# Patient Record
Sex: Female | Born: 1970 | Race: White | Hispanic: Yes | Marital: Married | Smoking: Never smoker
Health system: Southern US, Community
[De-identification: ages and names within clinical notes are randomized; demographics above are authoritative.]

## PROBLEM LIST (undated history)

## (undated) DIAGNOSIS — I10 Essential (primary) hypertension: Secondary | ICD-10-CM

---

## 2004-04-19 ENCOUNTER — Inpatient Hospital Stay (HOSPITAL_COMMUNITY): Admission: AD | Admit: 2004-04-19 | Discharge: 2004-04-20 | Payer: Self-pay | Admitting: *Deleted

## 2005-11-17 ENCOUNTER — Observation Stay (HOSPITAL_COMMUNITY): Admission: EM | Admit: 2005-11-17 | Discharge: 2005-11-18 | Payer: Self-pay | Admitting: Emergency Medicine

## 2007-12-10 ENCOUNTER — Inpatient Hospital Stay (HOSPITAL_COMMUNITY): Admission: AD | Admit: 2007-12-10 | Discharge: 2007-12-13 | Payer: Self-pay | Admitting: Obstetrics
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
## 2011-02-27 ENCOUNTER — Other Ambulatory Visit: Payer: Self-pay | Admitting: Family Medicine

## 2011-02-27 DIAGNOSIS — Z1231 Encounter for screening mammogram for malignant neoplasm of breast: Secondary | ICD-10-CM

## 2011-03-09 ENCOUNTER — Ambulatory Visit (HOSPITAL_COMMUNITY)
Admission: RE | Admit: 2011-03-09 | Discharge: 2011-03-09 | Disposition: A | Discharge status: Home / Self Care | Payer: Self-pay | Source: Ambulatory Visit | Attending: Family Medicine | Admitting: Family Medicine

## 2011-03-09 DIAGNOSIS — Z1231 Encounter for screening mammogram for malignant neoplasm of breast: Secondary | ICD-10-CM

## 2011-06-12 LAB — CBC
HCT: 29.5 — ABNORMAL LOW
HCT: 34 — ABNORMAL LOW
Hemoglobin: 10.4 — ABNORMAL LOW
Hemoglobin: 12.1
MCHC: 35
MCHC: 35.7
MCV: 90
MCV: 91.5
Platelets: 167
Platelets: 219
RBC: 3.23 — ABNORMAL LOW
RBC: 3.78 — ABNORMAL LOW
RDW: 13.8
RDW: 13.9
WBC: 10.5
WBC: 13.2 — ABNORMAL HIGH

## 2011-06-12 LAB — RPR: RPR Ser Ql: NONREACTIVE

## 2011-06-12 LAB — RAPID HIV SCREEN (WH-MAU): Rapid HIV Screen: NONREACTIVE

## 2014-04-16 ENCOUNTER — Encounter (HOSPITAL_COMMUNITY): Payer: Self-pay | Admitting: Emergency Medicine

## 2014-04-16 ENCOUNTER — Emergency Department (INDEPENDENT_AMBULATORY_CARE_PROVIDER_SITE_OTHER): Payer: Self-pay

## 2014-04-16 ENCOUNTER — Emergency Department (INDEPENDENT_AMBULATORY_CARE_PROVIDER_SITE_OTHER)
Admission: EM | Admit: 2014-04-16 | Discharge: 2014-04-16 | Disposition: A | Discharge status: Home / Self Care | Payer: Self-pay | Source: Home / Self Care | Attending: Family Medicine | Admitting: Family Medicine

## 2014-04-16 DIAGNOSIS — S335XXA Sprain of ligaments of lumbar spine, initial encounter: Secondary | ICD-10-CM

## 2014-04-16 DIAGNOSIS — N390 Urinary tract infection, site not specified: Secondary | ICD-10-CM

## 2014-04-16 DIAGNOSIS — S39012A Strain of muscle, fascia and tendon of lower back, initial encounter: Secondary | ICD-10-CM

## 2014-04-16 LAB — POCT URINALYSIS DIP (DEVICE)
Bilirubin Urine: NEGATIVE
Glucose, UA: NEGATIVE mg/dL
Ketones, ur: NEGATIVE mg/dL
Nitrite: NEGATIVE
Protein, ur: NEGATIVE mg/dL
Specific Gravity, Urine: 1.01 (ref 1.005–1.030)
Urobilinogen, UA: 0.2 mg/dL (ref 0.0–1.0)
pH: 6.5 (ref 5.0–8.0)

## 2014-04-16 MED ORDER — CEPHALEXIN 500 MG PO CAPS: 500.0000 mg | ORAL_CAPSULE | Freq: Four times a day (QID) | ORAL | Status: DC

## 2014-04-16 MED ORDER — CYCLOBENZAPRINE HCL 5 MG PO TABS: 5.0000 mg | ORAL_TABLET | Freq: Three times a day (TID) | ORAL | Status: DC | PRN

## 2014-04-16 NOTE — Discharge Instructions (Signed)
Take all of medicine as directed, drink lots of fluids, see your doctor if further problems. Continue light duty at work for 5 more days, heat to back.

## 2014-04-16 NOTE — ED Notes (Signed)
C/o constant back pain onset 1 week ago.  No known injury.  She has tried ice packs.  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

## 2014-04-16 NOTE — ED Provider Notes (Signed)
CSN: [id number]     Arrival date & time 04/16/14  1618 History   First MD Initiated Contact with Patient 04/16/14 1633     Chief Complaint  Patient presents with  . Back Pain   (Consider location/radiation/quality/duration/timing/severity/associated sxs/prior Treatment) Patient is a 43 y.o. female presenting with back pain. The history is provided by the patient. The history is limited by a language barrier. Language interpreter used: daughter transl.  Back Pain Location:  Lumbar spine Quality:  Stabbing Radiates to:  Does not radiate Pain severity:  Moderate Onset quality:  Gradual Duration:  1 week Progression:  Unchanged Chronicity:  New Context: lifting heavy objects   Context comment:  At machine at work, has been given light duty at work,no prior back pain hx. Ineffective treatments:  Cold packs Associated symptoms: no abdominal pain, no bladder incontinence, no chest pain, no dysuria, no fever, no leg pain, no numbness, no paresthesias and no weakness     History reviewed. No pertinent past medical history. History reviewed. No pertinent past surgical history. History reviewed. No pertinent family history. History  Substance Use Topics  . Smoking status: Never Smoker   . Smokeless tobacco: Not on file  . Alcohol Use: No   OB History   Grav Para Term Preterm Abortions TAB SAB Ect Mult Living                 Review of Systems  Constitutional: Negative.  Negative for fever.  Cardiovascular: Negative for chest pain.  Gastrointestinal: Negative.  Negative for abdominal pain.  Genitourinary: Negative.  Negative for bladder incontinence and dysuria.  Musculoskeletal: Positive for back pain. Negative for gait problem and joint swelling.  Skin: Negative.   Neurological: Negative for weakness, numbness and paresthesias.    Allergies  Review of patient's allergies indicates no known allergies.  Home Medications   Prior to Admission medications   Medication Sig Start  Date End Date Taking? Authorizing Provider  cephALEXin (KEFLEX) 500 MG capsule Take 1 capsule (500 mg total) by mouth 4 (four) times daily. Take all of medicine and drink lots of fluids 04/16/14   Linna Hoff, MD  cyclobenzaprine (FLEXERIL) 5 MG tablet Take 1 tablet (5 mg total) by mouth 3 (three) times daily as needed for muscle spasms. 04/16/14   Linna Hoff, MD   BP 124/78  Pulse 63  Temp(Src) 98.2 F (36.8 C) (Oral)  Resp 20  SpO2 98%  LMP 03/07/2014 Physical Exam  Nursing note and vitals reviewed. Constitutional: She is oriented to person, place, and time. She appears well-developed and well-nourished.  Abdominal: Soft. Bowel sounds are normal. She exhibits no distension.  Musculoskeletal: She exhibits tenderness.       Lumbar back: She exhibits tenderness, pain and spasm. She exhibits normal pulse.       Back:  Neurological: She is alert and oriented to person, place, and time.  Skin: Skin is warm and dry.    ED Course  Procedures (including critical care time) Labs Review Labs Reviewed  POCT URINALYSIS DIP (DEVICE) - Abnormal; Notable for the following:    Hgb urine dipstick SMALL (*)    Leukocytes, UA SMALL (*)    All other components within normal limits  URINE CULTURE    Imaging Review Dg Lumbar Spine Complete  04/16/2014   CLINICAL DATA:  Low back pain on right side and into right hip for the past week. No injury.  EXAM: LUMBAR SPINE - COMPLETE 4+ VIEW  COMPARISON:  None.  FINDINGS: Normal alignment lumbar spine without significant disc space narrowing.  Mild left-sided L5-S1 facet joint degenerative changes. No pars defect detected.  Vascular calcifications suspected which would represent age advanced atherosclerotic disease.  IMPRESSION: Normal alignment lumbar spine without significant disc space narrowing.  Mild left-sided L5-S1 facet joint degenerative changes. No pars defect detected.  Vascular calcifications suspected which would represent age advanced  atherosclerotic disease.   Electronically Signed   By: Bridgett LarssonSteve  Olson M.D.   On: 04/16/2014 17:32   X-rays reviewed and report per radiologist.   MDM   1. UTI (lower urinary tract infection)   2. Low back strain, initial encounter        Linna HoffJames D Kalon Erhardt, MD 04/16/14 1801

## 2014-04-18 LAB — URINE CULTURE: Colony Count: 5000

## 2014-10-27 ENCOUNTER — Other Ambulatory Visit (HOSPITAL_COMMUNITY): Payer: Self-pay | Admitting: Nurse Practitioner

## 2014-10-27 DIAGNOSIS — Z1231 Encounter for screening mammogram for malignant neoplasm of breast: Secondary | ICD-10-CM

## 2014-11-09 ENCOUNTER — Ambulatory Visit (HOSPITAL_COMMUNITY)
Admission: RE | Admit: 2014-11-09 | Discharge: 2014-11-09 | Disposition: A | Discharge status: Home / Self Care | Payer: Self-pay | Source: Ambulatory Visit | Attending: Nurse Practitioner | Admitting: Nurse Practitioner

## 2014-11-09 DIAGNOSIS — Z1231 Encounter for screening mammogram for malignant neoplasm of breast: Secondary | ICD-10-CM

## 2014-11-27 ENCOUNTER — Encounter (HOSPITAL_COMMUNITY): Payer: Self-pay | Admitting: Emergency Medicine

## 2014-11-27 ENCOUNTER — Emergency Department (INDEPENDENT_AMBULATORY_CARE_PROVIDER_SITE_OTHER)
Admission: EM | Admit: 2014-11-27 | Discharge: 2014-11-27 | Disposition: A | Discharge status: Home / Self Care | Payer: Self-pay | Source: Home / Self Care | Attending: Emergency Medicine | Admitting: Emergency Medicine

## 2014-11-27 DIAGNOSIS — N200 Calculus of kidney: Secondary | ICD-10-CM

## 2014-11-27 LAB — POCT URINALYSIS DIP (DEVICE)
Bilirubin Urine: NEGATIVE
Glucose, UA: NEGATIVE mg/dL
Ketones, ur: NEGATIVE mg/dL
Leukocytes, UA: NEGATIVE
Nitrite: NEGATIVE
Protein, ur: NEGATIVE mg/dL
Specific Gravity, Urine: 1.015 (ref 1.005–1.030)
Urobilinogen, UA: 0.2 mg/dL (ref 0.0–1.0)
pH: 7.5 (ref 5.0–8.0)

## 2014-11-27 LAB — POCT PREGNANCY, URINE: Preg Test, Ur: NEGATIVE

## 2014-11-27 MED ORDER — IBUPROFEN 800 MG PO TABS: 800.0000 mg | ORAL_TABLET | Freq: Three times a day (TID) | ORAL | Status: AC

## 2014-11-27 MED ORDER — CEPHALEXIN 500 MG PO CAPS: 500.0000 mg | ORAL_CAPSULE | Freq: Four times a day (QID) | ORAL | Status: DC

## 2014-11-27 MED ORDER — TAMSULOSIN HCL 0.4 MG PO CAPS: 0.4000 mg | ORAL_CAPSULE | Freq: Every day | ORAL | Status: DC

## 2014-11-27 MED ORDER — HYDROCODONE-ACETAMINOPHEN 5-325 MG PO TABS: 1.0000 | ORAL_TABLET | Freq: Four times a day (QID) | ORAL | Status: DC | PRN

## 2014-11-27 NOTE — Discharge Instructions (Signed)
I think you have a kidney stone. Drink plenty of fluids. Take Flomax 1 pill daily to help the stone pass. Take ibuprofen 800 mg 3 times a day to help with pain. Use the Norco every 6 hours as needed for severe pain. Do not drive while taking this medicine. Take the Keflex 4 times a day for 5 days for possible infection. If your pain is worsening or does not improve in the next week, please go [facility].

## 2014-11-27 NOTE — ED Provider Notes (Signed)
CSN: [id number]     Arrival date & time 11/27/14  1639 History   First MD Initiated Contact with Patient 11/27/14 1742     Chief Complaint  Patient presents with  . Urinary Tract Infection   (Consider location/radiation/quality/duration/timing/severity/associated sxs/prior Treatment) HPI  She is a 44 year old woman here for evaluation of right flank pain. Her daughter is present and acts as interpreter.  She states for the last 4 weeks she has had right flank pain. She also reports some urinary frequency and urgency. She reports some mild dysuria. No fevers or chills. No nausea or vomiting. She states she is currently on her period.  History reviewed. No pertinent past medical history. History reviewed. No pertinent past surgical history. No family history on file. History  Substance Use Topics  . Smoking status: Never Smoker   . Smokeless tobacco: Not on file  . Alcohol Use: No   OB History    No data available     Review of Systems As in history of present illness Allergies  Review of patient's allergies indicates no known allergies.  Home Medications   Prior to Admission medications   Medication Sig Start Date End Date Taking? Authorizing Provider  cephALEXin (KEFLEX) 500 MG capsule Take 1 capsule (500 mg total) by mouth 4 (four) times daily. Take all of medicine and drink lots of fluids 11/27/14   Charm RingsErin J Laurana Magistro, MD  cyclobenzaprine (FLEXERIL) 5 MG tablet Take 1 tablet (5 mg total) by mouth 3 (three) times daily as needed for muscle spasms. 04/16/14   Linna HoffJames D Kindl, MD  HYDROcodone-acetaminophen (NORCO) 5-325 MG per tablet Take 1 tablet by mouth every 6 (six) hours as needed for moderate pain. 11/27/14   Charm RingsErin J Mardella Nuckles, MD  ibuprofen (ADVIL,MOTRIN) 800 MG tablet Take 1 tablet (800 mg total) by mouth 3 (three) times daily. 11/27/14   Charm RingsErin J Maiah Sinning, MD  tamsulosin (FLOMAX) 0.4 MG CAPS capsule Take 1 capsule (0.4 mg total) by mouth daily. 11/27/14   Charm RingsErin J Maisen Klingler, MD   BP 140/84 mmHg   Pulse 63  Temp(Src) 98.4 F (36.9 C) (Oral)  Resp 20  SpO2 99%  LMP 11/22/2014 Physical Exam  Constitutional: She is oriented to person, place, and time. She appears well-developed and well-nourished. No distress.  Neck: Neck supple.  Cardiovascular: Normal rate.   Pulmonary/Chest: Effort normal.  Abdominal: Soft. Bowel sounds are normal. She exhibits no distension. There is tenderness (mild in suprapubic).  Positive right CVA tenderness  Neurological: She is alert and oriented to person, place, and time.    ED Course  Procedures (including critical care time) Labs Review Labs Reviewed  POCT URINALYSIS DIP (DEVICE) - Abnormal; Notable for the following:    Hgb urine dipstick TRACE (*)    All other components within normal limits  POCT PREGNANCY, URINE    Imaging Review No results found.   MDM   1. Kidney stone    UA concerning for kidney stone. We'll treat with Flomax, ibuprofen, Norco. We'll also cover with Keflex for 5 days. If her symptoms worsen or do not improve in the next week, she will go [facility] for further evaluation.    Charm RingsErin J Corry Ihnen, MD 11/27/14 [id number]

## 2014-11-27 NOTE — ED Notes (Addendum)
C/o cold sx onset 4 weeks Sx include urinary freq/urgency and back pain Denies dysuria, fevers, chills, abd pain Alert, no signs of acute distress.

## 2015-12-07 IMAGING — CR DG LUMBAR SPINE COMPLETE 4+V
5 series · 5 of 5 positions shown · non-contrast
Comparison: None.

CLINICAL DATA: Low back pain on right side and into right hip for
the past week. No injury.

EXAM:
LUMBAR SPINE - COMPLETE 4+ VIEW

[view not recorded (1 of 5)]
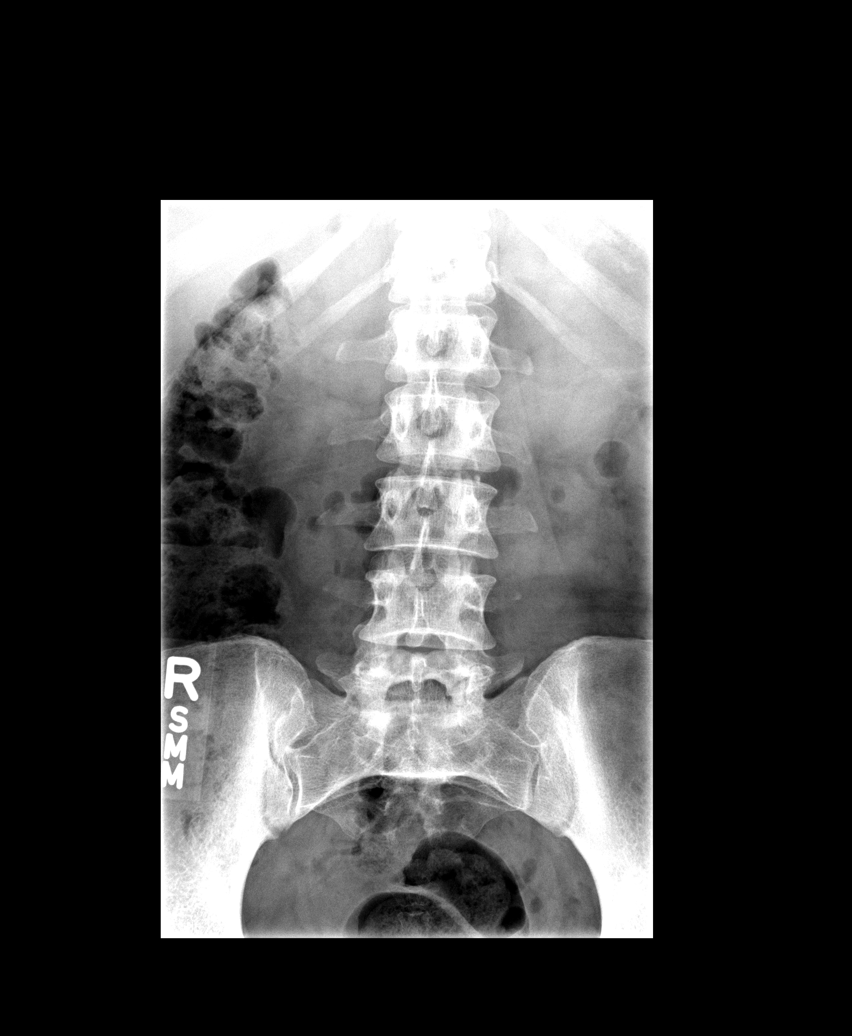

[view not recorded (2 of 5)]
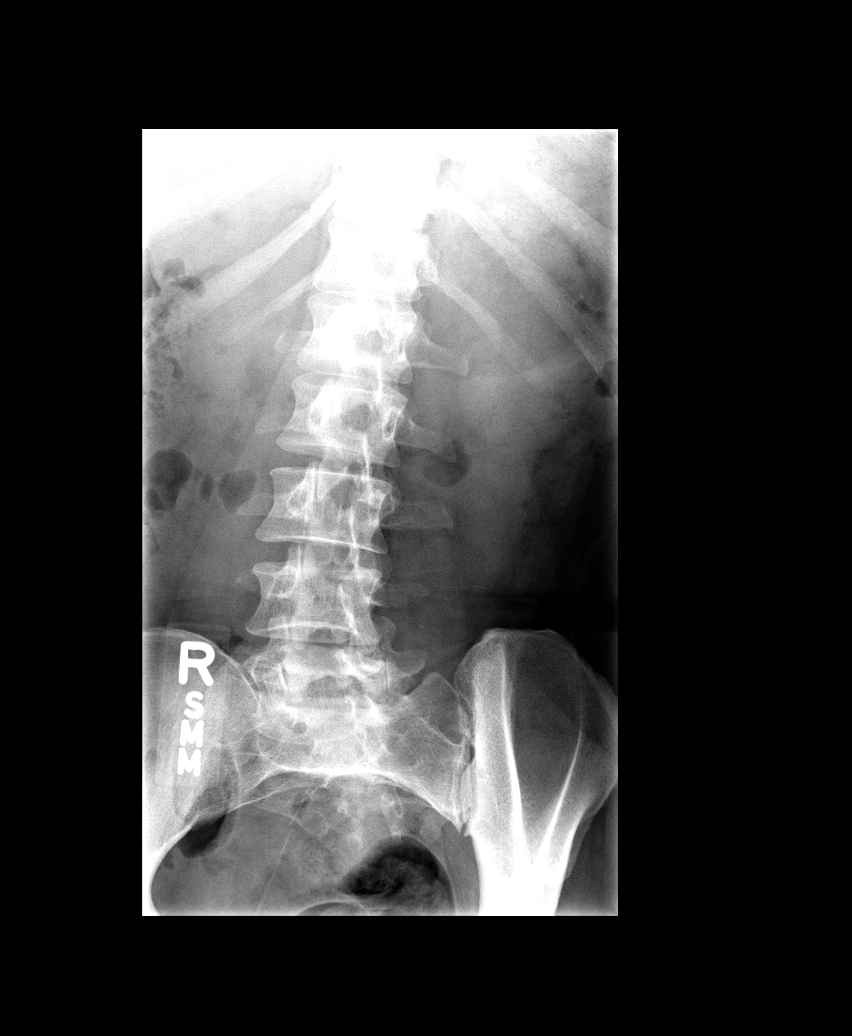

[view not recorded (3 of 5)]
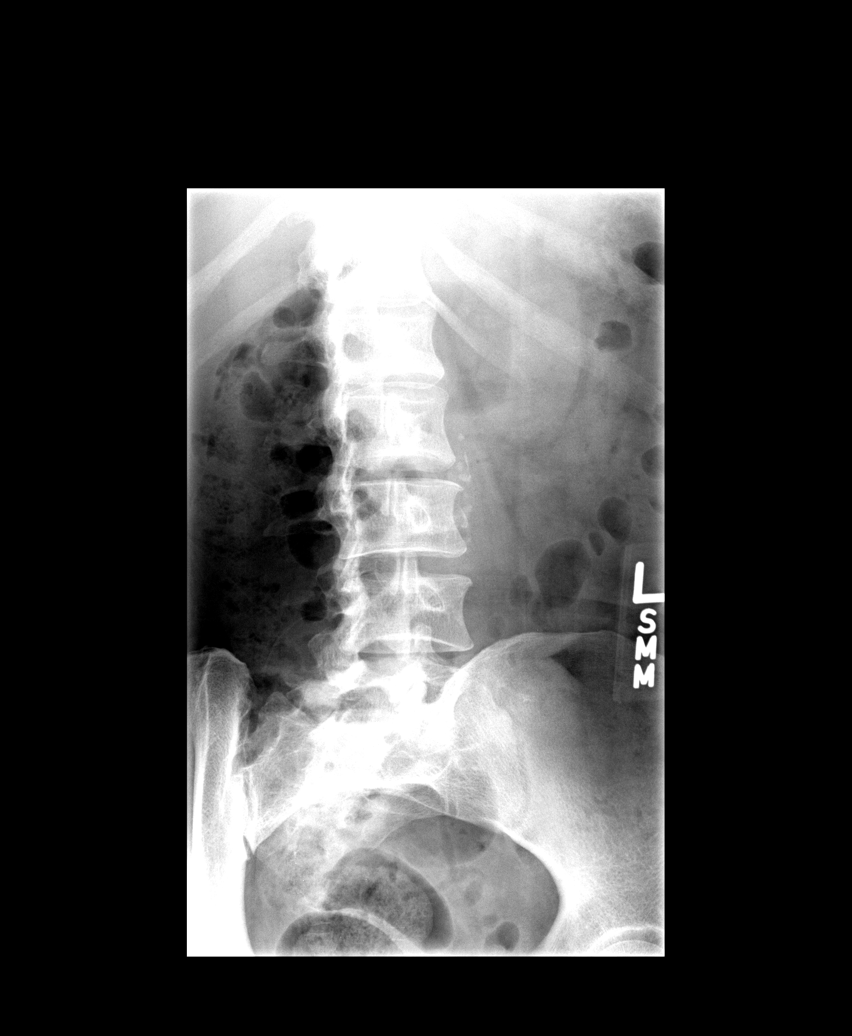

[view not recorded (4 of 5)]
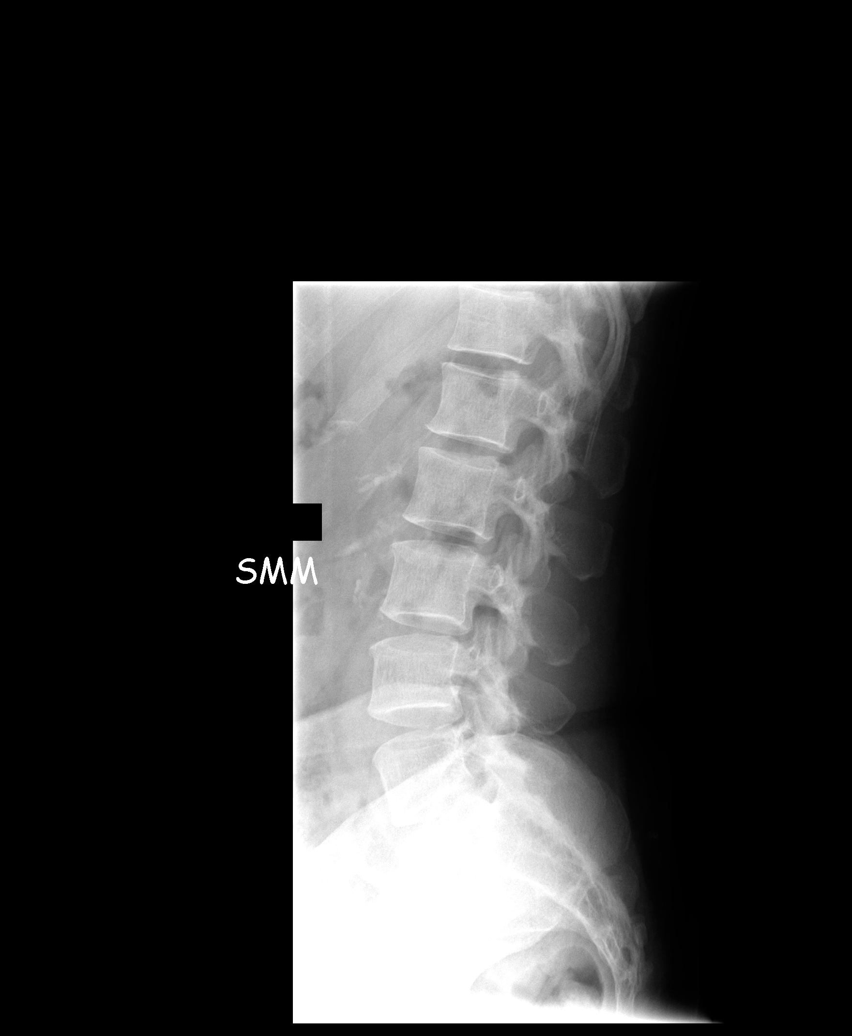

[view not recorded (5 of 5)]
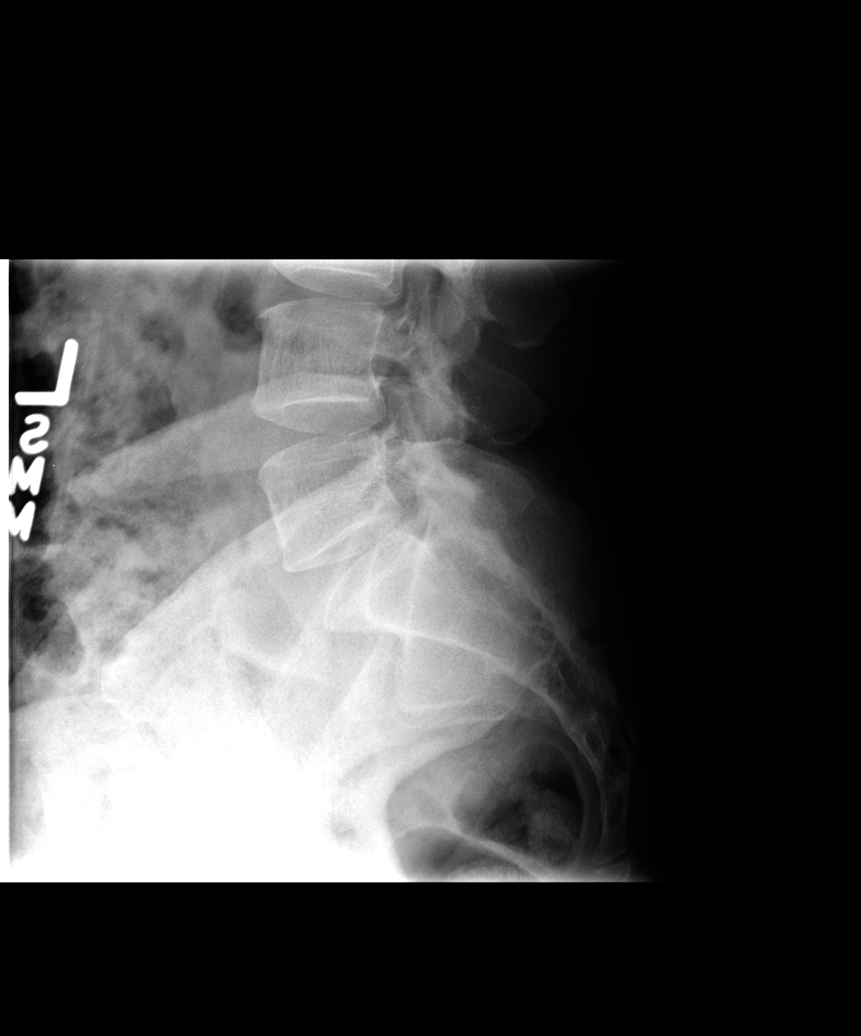

[5 of 5 positions shown; findings below may reference images not displayed]

FINDINGS: Normal alignment lumbar spine without significant disc space
narrowing.

Mild left-sided L5-S1 facet joint degenerative changes. No pars
defect detected.

Vascular calcifications suspected which would represent age advanced
atherosclerotic disease.
IMPRESSION: Normal alignment lumbar spine without significant disc space
narrowing.

Mild left-sided L5-S1 facet joint degenerative changes. No pars
defect detected.

Vascular calcifications suspected which would represent age advanced
atherosclerotic disease.

## 2016-04-29 ENCOUNTER — Encounter (HOSPITAL_COMMUNITY): Payer: Self-pay | Admitting: *Deleted

## 2016-04-29 ENCOUNTER — Ambulatory Visit (HOSPITAL_COMMUNITY)
Admission: EM | Admit: 2016-04-29 | Discharge: 2016-04-29 | Disposition: A | Discharge status: Home / Self Care | Payer: Self-pay | Attending: Family Medicine | Admitting: Family Medicine

## 2016-04-29 DIAGNOSIS — N390 Urinary tract infection, site not specified: Secondary | ICD-10-CM

## 2016-04-29 HISTORY — DX: Essential (primary) hypertension: I10

## 2016-04-29 LAB — POCT URINALYSIS DIP (DEVICE)
Bilirubin Urine: NEGATIVE
Glucose, UA: NEGATIVE mg/dL
Ketones, ur: NEGATIVE mg/dL
Leukocytes, UA: NEGATIVE
Nitrite: NEGATIVE
Protein, ur: NEGATIVE mg/dL
Specific Gravity, Urine: 1.02 (ref 1.005–1.030)
Urobilinogen, UA: 1 mg/dL (ref 0.0–1.0)
pH: 7 (ref 5.0–8.0)

## 2016-04-29 LAB — POCT PREGNANCY, URINE: Preg Test, Ur: NEGATIVE

## 2016-04-29 MED ORDER — CEPHALEXIN 500 MG PO CAPS: 500.0000 mg | ORAL_CAPSULE | Freq: Four times a day (QID) | ORAL | 0 refills | Status: DC

## 2016-04-29 NOTE — ED Provider Notes (Signed)
[facility]    CSN: [id number] Arrival date & time: 04/29/16  1453  First Provider Contact:  First MD Initiated Contact with Patient 04/29/16 1539        History   Chief Complaint Chief Complaint  Patient presents with  . Abdominal Pain  . Dysuria    HPI Kayla Weeks is a 45 y.o. female.    Dysuria  Pain quality:  Burning Pain severity:  Mild Onset quality:  Gradual Duration:  4 days Progression:  Unchanged Chronicity:  New Recent urinary tract infections: no   Relieved by:  None tried Worsened by:  Nothing Ineffective treatments:  None tried Urinary symptoms: frequent urination   Associated symptoms: abdominal pain and nausea   Associated symptoms: no fever, no flank pain, no vaginal discharge and no vomiting     Past Medical History:  Diagnosis Date  . Hypertension    No meds needed    There are no active problems to display for this patient.   History reviewed. No pertinent surgical history.  OB History    No data available       Home Medications    Prior to Admission medications   Medication Sig Start Date End Date Taking? Authorizing Provider  cephALEXin (KEFLEX) 500 MG capsule Take 1 capsule (500 mg total) by mouth 4 (four) times daily. Take all of medicine and drink lots of fluids 11/27/14   Charm Rings, MD  cyclobenzaprine (FLEXERIL) 5 MG tablet Take 1 tablet (5 mg total) by mouth 3 (three) times daily as needed for muscle spasms. 04/16/14   Linna Hoff, MD  HYDROcodone-acetaminophen (NORCO) 5-325 MG per tablet Take 1 tablet by mouth every 6 (six) hours as needed for moderate pain. 11/27/14   Charm Rings, MD  ibuprofen (ADVIL,MOTRIN) 800 MG tablet Take 1 tablet (800 mg total) by mouth 3 (three) times daily. 11/27/14   Charm Rings, MD  tamsulosin (FLOMAX) 0.4 MG CAPS capsule Take 1 capsule (0.4 mg total) by mouth daily. 11/27/14   Charm Rings, MD    Family History No family history on file.  Social History Social History    Substance Use Topics  . Smoking status: Never Smoker  . Smokeless tobacco: Not on file  . Alcohol use No     Allergies   Review of patient's allergies indicates no known allergies.   Review of Systems Review of Systems  Constitutional: Negative for fever.  Gastrointestinal: Positive for abdominal pain and nausea. Negative for vomiting.  Genitourinary: Positive for dysuria, frequency, pelvic pain and urgency. Negative for flank pain, vaginal discharge and vaginal pain.  All other systems reviewed and are negative.    Physical Exam Triage Vital Signs ED Triage Vitals  Enc Vitals Group     BP 04/29/16 1540 134/84     Pulse Rate 04/29/16 1540 60     Resp 04/29/16 1540 16     Temp 04/29/16 1540 97.8 F (36.6 C)     Temp Source 04/29/16 1540 Oral     SpO2 04/29/16 1540 99 %     Weight --      Height --      Head Circumference --      Peak Flow --      Pain Score 04/29/16 1543 8     Pain Loc --      Pain Edu? --      Excl. in GC? --    No data found.   Updated  Vital Signs BP 134/84   Pulse 60   Temp 97.8 F (36.6 C) (Oral)   Resp 16   LMP 03/07/2016 (Approximate)   SpO2 99%   Visual Acuity Right Eye Distance:   Left Eye Distance:   Bilateral Distance:    Right Eye Near:   Left Eye Near:    Bilateral Near:     Physical Exam  Constitutional: She appears well-developed and well-nourished. No distress.  Abdominal: Soft. Bowel sounds are normal. She exhibits no mass. There is tenderness in the suprapubic area. There is no rebound, no guarding, no tenderness at McBurney's point and negative Murphy's sign. No hernia.  Nursing note and vitals reviewed.    UC Treatments / Results  Labs (all labs ordered are listed, but only abnormal results are displayed) Labs Reviewed  POCT URINALYSIS DIP (DEVICE) - Abnormal; Notable for the following:       Result Value   Hgb urine dipstick MODERATE (*)    All other components within normal limits  POCT PREGNANCY, URINE     EKG  EKG Interpretation None       Radiology No results found.  Procedures Procedures (including critical care time)  Medications Ordered in UC Medications - No data to display   Initial Impression / Assessment and Plan / UC Course  I have reviewed the triage vital signs and the nursing notes.  Pertinent labs & imaging results that were available during my care of the patient were reviewed by me and considered in my medical decision making (see chart for details).  Clinical Course      Final Clinical Impressions(s) / UC Diagnoses   Final diagnoses:  None    New Prescriptions New Prescriptions   No medications on file     Linna HoffJames D Laquashia Mergenthaler, MD 04/29/16 1556

## 2016-04-29 NOTE — ED Triage Notes (Signed)
C/O abd pain, dysuria, urinary urgency with slight nausea x 2 days.    Denies fevers.

## 2017-07-20 ENCOUNTER — Encounter (HOSPITAL_COMMUNITY): Payer: Self-pay | Admitting: Emergency Medicine

## 2017-07-20 ENCOUNTER — Ambulatory Visit (HOSPITAL_COMMUNITY)
Admission: EM | Admit: 2017-07-20 | Discharge: 2017-07-20 | Disposition: A | Discharge status: Home / Self Care | Payer: Self-pay | Attending: Emergency Medicine | Admitting: Emergency Medicine

## 2017-07-20 DIAGNOSIS — L209 Atopic dermatitis, unspecified: Secondary | ICD-10-CM

## 2017-07-20 MED ORDER — TRIAMCINOLONE ACETONIDE 0.1 % EX CREA
1.0000 | TOPICAL_CREAM | Freq: Two times a day (BID) | CUTANEOUS | 0 refills | Status: DC
Start: 2017-07-20 — End: 2019-03-12

## 2017-07-20 NOTE — ED Provider Notes (Signed)
[facility]    CSN: [id number] Arrival date & time: 07/20/17  1551     History   Chief Complaint Chief Complaint  Patient presents with  . Rash    HPI Kayla Weeks is a 46 y.o. female.   Kayla HesselbachMaria presents with complaints of itching and rash to her upper back with has been ongoing for the past 6 Weeks approximately. Spanish interpreter used to collect history and physical. She states it has moved to different areas to her back. It has not worsened but has not improved. Denies previous similar. Denies any new skin care products, detergents or known allergen exposures. Without history of asthma. She states it feels sensitive when she scratches it. She has applied aloe vera ointment to it which has not helped. No other family members with rash. It is not to any other extremities or locations.   ROS per HPI.       Past Medical History:  Diagnosis Date  . Hypertension    No meds needed    There are no active problems to display for this patient.   History reviewed. No pertinent surgical history.  OB History    No data available       Home Medications    Prior to Admission medications   Medication Sig Start Date End Date Taking? Authorizing Provider  cephALEXin (KEFLEX) 500 MG capsule Take 1 capsule (500 mg total) by mouth 4 (four) times daily. Take all of medicine and drink lots of fluids 04/29/16   Linna HoffKindl, James D, MD  cyclobenzaprine (FLEXERIL) 5 MG tablet Take 1 tablet (5 mg total) by mouth 3 (three) times daily as needed for muscle spasms. 04/16/14   Linna HoffKindl, James D, MD  HYDROcodone-acetaminophen (NORCO) 5-325 MG per tablet Take 1 tablet by mouth every 6 (six) hours as needed for moderate pain. 11/27/14   Charm RingsHonig, Erin J, MD  ibuprofen (ADVIL,MOTRIN) 800 MG tablet Take 1 tablet (800 mg total) by mouth 3 (three) times daily. 11/27/14   Charm RingsHonig, Erin J, MD  tamsulosin (FLOMAX) 0.4 MG CAPS capsule Take 1 capsule (0.4 mg total) by mouth daily. 11/27/14   Charm RingsHonig, Erin J, MD   triamcinolone cream (KENALOG) 0.1 % Apply 1 application topically 2 (two) times daily. 07/20/17   Georgetta HaberBurky, Lexander Tremblay B, NP    Family History History reviewed. No pertinent family history.  Social History Social History  Substance Use Topics  . Smoking status: Never Smoker  . Smokeless tobacco: Never Used  . Alcohol use No     Allergies   Patient has no known allergies.   Review of Systems Review of Systems   Physical Exam Triage Vital Signs ED Triage Vitals [07/20/17 1559]  Enc Vitals Group     BP 128/70     Pulse Rate 61     Resp 18     Temp 98.2 F (36.8 C)     Temp Source Oral     SpO2 98 %     Weight      Height      Head Circumference      Peak Flow      Pain Score      Pain Loc      Pain Edu?      Excl. in GC?    No data found.   Updated Vital Signs BP 128/70 (BP Location: Left Arm)   Pulse 61   Temp 98.2 F (36.8 C) (Oral)   Resp 18   SpO2 98%  Visual Acuity Right Eye Distance:   Left Eye Distance:   Bilateral Distance:    Right Eye Near:   Left Eye Near:    Bilateral Near:     Physical Exam  Constitutional: She is oriented to person, place, and time. She appears well-developed and well-nourished.  Cardiovascular: Normal rate and regular rhythm.   Pulmonary/Chest: Effort normal and breath sounds normal.  Neurological: She is alert and oriented to person, place, and time.  Skin: Skin is warm and dry.     Two skin toned flaky round lesions, flat without redness swelling drainage or vesicle. Approximately 3mm in diameter  Vitals reviewed.    UC Treatments / Results  Labs (all labs ordered are listed, but only abnormal results are displayed) Labs Reviewed - No data to display  EKG  EKG Interpretation None       Radiology No results found.  Procedures Procedures (including critical care time)  Medications Ordered in UC Medications - No data to display   Initial Impression / Assessment and Plan / UC Course  I have  reviewed the triage vital signs and the nursing notes.  Pertinent labs & imaging results that were available during my care of the patient were reviewed by me and considered in my medical decision making (see chart for details).     Lesions consistent with dermatitis, especially as they have been waxing and waning in differing locations. Differential- tinea. Provided dermatitis education an kenalog cream to apply. If symptoms worsen or do not improve in the next 1-2 Weeks to return to be seen or to follow up with PCP. Patient verbalized understanding and agreeable to plan.     Final Clinical Impressions(s) / UC Diagnoses   Final diagnoses:  Atopic dermatitis, unspecified type    New Prescriptions New Prescriptions   TRIAMCINOLONE CREAM (KENALOG) 0.1 %    Apply 1 application topically 2 (two) times daily.     Controlled Substance Prescriptions [place] Controlled Substance Registry consulted? Not Applicable   Georgetta Haber, NP 07/20/17 [phone]

## 2017-07-20 NOTE — ED Triage Notes (Signed)
Here for spontaneous rash on body onset 5 weeks  Reports rash starts out small but when she begins to scratch, the rash will get worse  Denies fevers, chills, new meds/hygiene products/foods  A&O x4... NAD... Ambulatory

## 2019-03-12 ENCOUNTER — Other Ambulatory Visit: Payer: Self-pay

## 2019-03-12 ENCOUNTER — Ambulatory Visit (HOSPITAL_COMMUNITY)
Admission: EM | Admit: 2019-03-12 | Discharge: 2019-03-12 | Disposition: A | Discharge status: Home / Self Care | Payer: Self-pay | Attending: Family Medicine | Admitting: Family Medicine

## 2019-03-12 ENCOUNTER — Encounter (HOSPITAL_COMMUNITY): Payer: Self-pay | Admitting: Emergency Medicine

## 2019-03-12 DIAGNOSIS — M791 Myalgia, unspecified site: Secondary | ICD-10-CM | POA: Insufficient documentation

## 2019-03-12 DIAGNOSIS — T148XXA Other injury of unspecified body region, initial encounter: Secondary | ICD-10-CM

## 2019-03-12 DIAGNOSIS — R222 Localized swelling, mass and lump, trunk: Secondary | ICD-10-CM | POA: Insufficient documentation

## 2019-03-12 DIAGNOSIS — R5383 Other fatigue: Secondary | ICD-10-CM | POA: Insufficient documentation

## 2019-03-12 DIAGNOSIS — W57XXXA Bitten or stung by nonvenomous insect and other nonvenomous arthropods, initial encounter: Secondary | ICD-10-CM | POA: Insufficient documentation

## 2019-03-12 MED ORDER — DOXYCYCLINE HYCLATE 100 MG PO TABS: 100.0000 mg | ORAL_TABLET | Freq: Two times a day (BID) | ORAL | 0 refills | Status: DC

## 2019-03-12 NOTE — ED Triage Notes (Addendum)
Patient found tick on back yesterday and removed tick.  Patient requesting blood work.  Patient feels tired, sore and achy  Patient has a small red bump on left side of lower back

## 2019-03-12 NOTE — ED Provider Notes (Signed)
[facility]    CSN: [id number] Arrival date & time: 03/12/19  1643     History   Chief Complaint Chief Complaint  Patient presents with  . Tick Removal    HPI Kayla PeeksMaria Weeks is a 48 y.o. female.   [facility]  Patient found tick on back yesterday and removed tick.  Patient requesting blood work.  Patient feels tired, sore and achy  Patient has a small red bump on left side of lower back       Past Medical History:  Diagnosis Date  . Hypertension    No meds needed    There are no active problems to display for this patient.   History reviewed. No pertinent surgical history.  OB History   No obstetric history on file.      Home Medications    Prior to Admission medications   Medication Sig Start Date End Date Taking? Authorizing Provider  doxycycline (VIBRA-TABS) 100 MG tablet Take 1 tablet (100 mg total) by mouth 2 (two) times daily. 03/12/19   Kayla Weeks, Kayla Sheek, MD  ibuprofen (ADVIL,MOTRIN) 800 MG tablet Take 1 tablet (800 mg total) by mouth 3 (three) times daily. 11/27/14   Kayla Weeks, Kayla J, MD    Family History History reviewed. No pertinent family history.  Social History Social History   Tobacco Use  . Smoking status: Never Smoker  . Smokeless tobacco: Never Used  Substance Use Topics  . Alcohol use: No  . Drug use: No     Allergies   Patient has no known allergies.   Review of Systems Review of Systems  Musculoskeletal: Positive for myalgias.  Skin: Positive for wound.  All other systems reviewed and are negative.    Physical Exam Triage Vital Signs ED Triage Vitals  Enc Vitals Group     BP 03/12/19 1722 133/83     Pulse Rate 03/12/19 1722 65     Resp 03/12/19 1722 18     Temp 03/12/19 1722 98.3 F (36.8 C)     Temp Source 03/12/19 1722 Oral     SpO2 03/12/19 1722 97 %     Weight --      Height --      Head Circumference --      Peak Flow --      Pain Score 03/12/19 1718 4     Pain Loc --    Pain Edu? --      Excl. in GC? --    No data found.  Updated Vital Signs BP 133/83 (BP Location: Right Arm)   Pulse 65   Temp 98.3 F (36.8 C) (Oral)   Resp 18   SpO2 97%    Physical Exam Vitals signs and nursing note reviewed.  Constitutional:      Appearance: Normal appearance. She is normal weight.  HENT:     Head: Normocephalic.     Mouth/Throat:     Mouth: Mucous membranes are moist.  Eyes:     Conjunctiva/sclera: Conjunctivae normal.  Neck:     Musculoskeletal: Normal range of motion and neck supple.  Pulmonary:     Effort: Pulmonary effort is normal.  Musculoskeletal: Normal range of motion.  Skin:    General: Skin is warm and dry.     Comments: One 4 mm raised red papule left flank  Neurological:     Mental Status: She is alert and oriented to person, place, and time.  Psychiatric:        Mood  and Affect: Mood normal.        Thought Content: Thought content normal.      UC Treatments / Results  Labs (all labs ordered are listed, but only abnormal results are displayed) Labs Reviewed  ROCKY MTN SPOTTED FVR ABS PNL(IGG+IGM)  EHRLICHIA ANTIBODY PANEL    EKG None  Radiology No results found.  Procedures Procedures (including critical care time)  Medications Ordered in UC Medications - No data to display  Initial Impression / Assessment and Plan / UC Course  I have reviewed the triage vital signs and the nursing notes.  Pertinent labs & imaging results that were available during my care of the patient were reviewed by me and considered in my medical decision making (see chart for details).    Final Clinical Impressions(s) / UC Diagnoses   Final diagnoses:  Tick bite, initial encounter   Discharge Instructions   None    ED Prescriptions    Medication Sig Dispense Auth. Provider   doxycycline (VIBRA-TABS) 100 MG tablet Take 1 tablet (100 mg total) by mouth 2 (two) times daily. 14 tablet Kayla Haber, MD     Controlled Substance  Prescriptions [place] Controlled Substance Registry consulted? Not Applicable   Kayla Haber, MD 03/12/19 1742

## 2019-03-13 LAB — EHRLICHIA ANTIBODY PANEL
E chaffeensis (HGE) Ab, IgG: NEGATIVE
E chaffeensis (HGE) Ab, IgM: NEGATIVE
E. Chaffeensis (HME) IgM Titer: NEGATIVE
E.Chaffeensis (HME) IgG: NEGATIVE

## 2019-03-14 ENCOUNTER — Telehealth (HOSPITAL_COMMUNITY): Payer: Self-pay | Admitting: Emergency Medicine

## 2019-03-14 LAB — ROCKY MTN SPOTTED FVR ABS PNL(IGG+IGM)
RMSF IgG: NEGATIVE
RMSF IgM: 0.94 index — ABNORMAL HIGH (ref 0.00–0.89)

## 2019-03-14 NOTE — Telephone Encounter (Signed)
Results for RMSF are equivocal, inconclusive. Pt is on doxy anyway, Dr. Meda Coffee reviewed chart, no further treatment necessary. [facility] notified. Attempted to reach patient. No answer at this time.

## 2022-12-05 ENCOUNTER — Encounter (HOSPITAL_COMMUNITY): Payer: Self-pay

## 2022-12-05 ENCOUNTER — Ambulatory Visit (HOSPITAL_COMMUNITY)
Admission: EM | Admit: 2022-12-05 | Discharge: 2022-12-05 | Disposition: A | Discharge status: Home / Self Care | Payer: Self-pay | Attending: Family Medicine | Admitting: Family Medicine

## 2022-12-05 ENCOUNTER — Ambulatory Visit (INDEPENDENT_AMBULATORY_CARE_PROVIDER_SITE_OTHER): Payer: Self-pay

## 2022-12-05 DIAGNOSIS — K5900 Constipation, unspecified: Secondary | ICD-10-CM

## 2022-12-05 DIAGNOSIS — R109 Unspecified abdominal pain: Secondary | ICD-10-CM

## 2022-12-05 DIAGNOSIS — R1031 Right lower quadrant pain: Secondary | ICD-10-CM

## 2022-12-05 LAB — POCT URINALYSIS DIPSTICK, ED / UC
Bilirubin Urine: NEGATIVE
Glucose, UA: NEGATIVE mg/dL
Ketones, ur: NEGATIVE mg/dL
Leukocytes,Ua: NEGATIVE
Nitrite: NEGATIVE
Protein, ur: NEGATIVE mg/dL
Specific Gravity, Urine: 1.015 (ref 1.005–1.030)
Urobilinogen, UA: 0.2 mg/dL (ref 0.0–1.0)
pH: 5.5 (ref 5.0–8.0)

## 2022-12-05 MED ORDER — KETOROLAC TROMETHAMINE 30 MG/ML IJ SOLN
30.0000 mg | Freq: Once | INTRAMUSCULAR | Status: AC
  Administered 2022-12-05: 30 mg via INTRAMUSCULAR

## 2022-12-05 MED ORDER — CEPHALEXIN 500 MG PO CAPS: 500.0000 mg | ORAL_CAPSULE | Freq: Three times a day (TID) | ORAL | 0 refills | Status: AC

## 2022-12-05 MED ORDER — KETOROLAC TROMETHAMINE 30 MG/ML IJ SOLN
INTRAMUSCULAR | Status: AC
  Filled 2022-12-05: qty 1

## 2022-12-05 MED ORDER — TAMSULOSIN HCL 0.4 MG PO CAPS: 0.4000 mg | ORAL_CAPSULE | Freq: Every day | ORAL | 0 refills | Status: AC

## 2022-12-05 NOTE — Discharge Instructions (Signed)
Le atendieron hoy por dolor abdominal y Social research officer, government de espalda. Lo que ms me preocupa es un clculo renal. He enviado varios medicamentos para ayudar con esto hoy. Te recomiendo que aumentes tu ingesta de lquidos. Tu radiografa tambin mostr estreimiento. Consiga mirilax de venta libre y utilcelo diariamente para ayudar con eso. Si el dolor empeora, presenta fiebre, escalofros, nuseas o vmitos, vaya a la sala de emergencias para una evaluacin adicional.  You were seen today for abdominal pain and back pain.  I am most concerned for a kidney stone.  I have sent out several medications to help with this today.  I recommend you increase your fluid intake.  Your xray also showed constipation.  Please get over the counter mirilax and use this daily to help with that.  If you have worsening pain, develop fever, chills, nausea or vomiting then please go to the ER for further evaluation.

## 2022-12-05 NOTE — ED Provider Notes (Signed)
[place]    CSN: [id number] Arrival date & time: 12/05/22  1323      History   Chief Complaint Chief Complaint  Patient presents with   Dysuria    HPI Kayla Weeks is a 52 y.o. female.   Patient is here for 3 weeks of right lower abdomen that wraps into the back.  Pain is constant.  She is also having urinary frequency and dysuria.  No n/v.  She is feeling some fevers/chills.  No diarrhea or constipation.  Eating and drinking normally.  She does have a h/o kidney stones and this feels similar.        Past Medical History:  Diagnosis Date   Hypertension    No meds needed    There are no problems to display for this patient.   History reviewed. No pertinent surgical history.  OB History   No obstetric history on file.      Home Medications    Prior to Admission medications   Medication Sig Start Date End Date Taking? Authorizing Provider  ibuprofen (ADVIL,MOTRIN) 800 MG tablet Take 1 tablet (800 mg total) by mouth 3 (three) times daily. 11/27/14  Yes Melony Overly, MD  doxycycline (VIBRA-TABS) 100 MG tablet Take 1 tablet (100 mg total) by mouth 2 (two) times daily. 03/12/19   Robyn Haber, MD    Family History History reviewed. No pertinent family history.  Social History Social History   Tobacco Use   Smoking status: Never   Smokeless tobacco: Never  Substance Use Topics   Alcohol use: No   Drug use: No     Allergies   Patient has no known allergies.   Review of Systems Review of Systems  Constitutional: Negative.   HENT: Negative.    Respiratory: Negative.    Cardiovascular: Negative.   Gastrointestinal:  Positive for abdominal pain.  Genitourinary:  Positive for dysuria, flank pain and frequency.  Hematological: Negative.   Psychiatric/Behavioral: Negative.       Physical Exam Triage Vital Signs ED Triage Vitals  Enc Vitals Group     BP 12/05/22 1405 110/74     Pulse Rate 12/05/22 1405 80     Resp 12/05/22  1405 12     Temp 12/05/22 1405 98.5 F (36.9 C)     Temp Source 12/05/22 1405 Oral     SpO2 12/05/22 1405 98 %     Weight --      Height --      Head Circumference --      Peak Flow --      Pain Score 12/05/22 1403 8     Pain Loc --      Pain Edu? --      Excl. in [place]? --    No data found.  Updated Vital Signs BP 110/74 (BP Location: Left Arm)   Pulse 80   Temp 98.5 F (36.9 C) (Oral)   Resp 12   SpO2 98%   Visual Acuity Right Eye Distance:   Left Eye Distance:   Bilateral Distance:    Right Eye Near:   Left Eye Near:    Bilateral Near:     Physical Exam Constitutional:      Appearance: Normal appearance.  Cardiovascular:     Rate and Rhythm: Normal rate and regular rhythm.  Pulmonary:     Effort: Pulmonary effort is normal.     Breath sounds: Normal breath sounds.  Abdominal:     Palpations: Abdomen is  soft.     Tenderness: There is abdominal tenderness.     Comments: +TTP to the RLQ and suprapubic area;  guarding or rebound noted  Neurological:     General: No focal deficit present.     Mental Status: She is alert.  Psychiatric:        Mood and Affect: Mood normal.      UC Treatments / Results  Labs (all labs ordered are listed, but only abnormal results are displayed) Labs Reviewed  POCT URINALYSIS DIPSTICK, ED / UC - Abnormal; Notable for the following components:      Result Value   Hgb urine dipstick MODERATE (*)    All other components within normal limits    EKG   Radiology DG Abd 1 View  Result Date: 12/05/2022 CLINICAL DATA:  Abdominal pain. EXAM: ABDOMEN - 1 VIEW COMPARISON:  None Available. FINDINGS: The bowel gas pattern is normal without gaseous distention. No radio-opaque calculi or other significant radiographic abnormality are seen. Increased stool noted consistent with constipation. IMPRESSION: Constipation. Electronically Signed   By: Sammie Bench M.D.   On: 12/05/2022 14:46    Procedures Procedures (including critical  care time)  Medications Ordered in UC Medications - No data to display  Initial Impression / Assessment and Plan / UC Course  I have reviewed the triage vital signs and the nursing notes.  Pertinent labs & imaging results that were available during my care of the patient were reviewed by me and considered in my medical decision making (see chart for details).   Final Clinical Impressions(s) / UC Diagnoses   Final diagnoses:  Flank pain  RLQ abdominal pain  Constipation, unspecified constipation type     Discharge Instructions      Le atendieron hoy por dolor abdominal y dolor de espalda. Lo que ms me preocupa es un clculo renal. He enviado varios medicamentos para ayudar con esto hoy. Te recomiendo que aumentes tu ingesta de lquidos. Tu radiografa tambin mostr estreimiento. Consiga mirilax de venta libre y utilcelo diariamente para ayudar con eso. Si el dolor empeora, presenta fiebre, escalofros, nuseas o vmitos, vaya a la sala de emergencias para una evaluacin adicional.  You were seen today for abdominal pain and back pain.  I am most concerned for a kidney stone.  I have sent out several medications to help with this today.  I recommend you increase your fluid intake.  Your xray also showed constipation.  Please get over the counter mirilax and use this daily to help with that.  If you have worsening pain, develop fever, chills, nausea or vomiting then please go to the ER for further evaluation.     ED Prescriptions     Medication Sig Dispense Auth. Provider   tamsulosin (FLOMAX) 0.4 MG CAPS capsule Take 1 capsule (0.4 mg total) by mouth daily. 7 capsule Roddrick Sharron, MD   cephALEXin (KEFLEX) 500 MG capsule Take 1 capsule (500 mg total) by mouth 3 (three) times daily for 7 days. 20 capsule Rondel Oh, MD      PDMP not reviewed this encounter.   Rondel Oh, MD 12/05/22 1501

## 2022-12-05 NOTE — ED Triage Notes (Signed)
Pt is here for possible UTI x 2wks pt has been using OTC Advil but, nothing today .

## 2023-01-11 ENCOUNTER — Other Ambulatory Visit: Payer: Self-pay | Admitting: Obstetrics and Gynecology

## 2023-01-11 DIAGNOSIS — Z1231 Encounter for screening mammogram for malignant neoplasm of breast: Secondary | ICD-10-CM

## 2023-03-01 ENCOUNTER — Ambulatory Visit: Payer: Self-pay | Admitting: Hematology and Oncology

## 2023-03-01 ENCOUNTER — Ambulatory Visit
Admission: RE | Admit: 2023-03-01 | Discharge: 2023-03-01 | Disposition: A | Discharge status: Home / Self Care | Payer: No Typology Code available for payment source | Source: Ambulatory Visit | Attending: Obstetrics and Gynecology | Admitting: Obstetrics and Gynecology

## 2023-03-01 VITALS — BP 128/72 | Ht <= 58 in | Wt 117.0 lb

## 2023-03-01 DIAGNOSIS — Z1231 Encounter for screening mammogram for malignant neoplasm of breast: Secondary | ICD-10-CM

## 2023-03-01 NOTE — Patient Instructions (Signed)
Taught Kayla Weeks about self breast awareness and gave educational materials to take home. Patient did not need a Pap smear today due to last Pap smear was in 05/18/21 per patient. Let her know [facility] will cover Pap smears every 5 years unless has a history of abnormal Pap smears. Referred patient to the [facility] for screening mammogram. Appointment scheduled for 03/01/23. Patient aware of appointment and will be there. Let patient know will follow up with her within the next couple weeks with results. Kayla Weeks verbalized understanding.  Pascal Lux, NP 9:07 AM

## 2023-03-01 NOTE — Progress Notes (Addendum)
Ms. Seniyah Esker is a 52 y.o. female who presents to [facility] clinic today with no complaints.    Pap Smear: Pap not smear completed today. Last Pap smear was 05/18/21 (per [facility] referral docs) and was negative with negative HPV. Per patient has no history of an abnormal Pap smear. Last Pap smear result is not available in Epic.   Physical exam: Breasts Breasts symmetrical. No skin abnormalities bilateral breasts. No nipple retraction bilateral breasts. No nipple discharge bilateral breasts. No lymphadenopathy. No lumps palpated bilateral breasts.       Pelvic/Bimanual Pap is not indicated today    Smoking History: Patient has never smoked. Not referred to quit line.    Patient Navigation: Patient education provided. Access to services provided for patient through [facility] program. Natale Lay, spanish interpreter provided. No transportation provided   Colorectal Cancer Screening: Per patient has had colonoscopy completed on 08/07/20 at [facility], internal hemorrhoids, repeat in 5 years (per [facility] referral docs)  No complaints today.    Breast and Cervical Cancer Risk Assessment: Patient does not have family history of breast cancer, known genetic mutations, or radiation treatment to the chest before age 19. Patient does not have history of cervical dysplasia, immunocompromised, or DES exposure in-utero.  Risk Assessment   No risk assessment data     A: [facility] exam without pap smear No complaints of concerning breast.  P: Referred patient to the [facility] for a screening mammogram. Appointment scheduled 03/01/2023 at 930 am.  Joette Catching, RN 03/01/2023 8:55 AM

## 2023-03-06 ENCOUNTER — Other Ambulatory Visit: Payer: Self-pay | Admitting: Obstetrics and Gynecology

## 2023-03-06 DIAGNOSIS — R928 Other abnormal and inconclusive findings on diagnostic imaging of breast: Secondary | ICD-10-CM

## 2023-03-19 ENCOUNTER — Ambulatory Visit
Admission: RE | Admit: 2023-03-19 | Discharge: 2023-03-19 | Disposition: A | Discharge status: Home / Self Care | Payer: No Typology Code available for payment source | Source: Ambulatory Visit | Attending: Obstetrics and Gynecology | Admitting: Obstetrics and Gynecology

## 2023-03-19 DIAGNOSIS — R928 Other abnormal and inconclusive findings on diagnostic imaging of breast: Secondary | ICD-10-CM

## 2024-02-27 ENCOUNTER — Ambulatory Visit (INDEPENDENT_AMBULATORY_CARE_PROVIDER_SITE_OTHER): Payer: Self-pay | Admitting: Surgical

## 2024-02-27 ENCOUNTER — Encounter: Payer: Self-pay | Admitting: Surgical

## 2024-02-27 VITALS — BP 146/83 | HR 75 | Wt 118.8 lb

## 2024-02-27 DIAGNOSIS — Z719 Counseling, unspecified: Secondary | ICD-10-CM

## 2024-02-27 NOTE — Progress Notes (Signed)
   Referring Provider No referring provider defined for this encounter.   CC:  Chief Complaint  Patient presents with   consult      Kayla Weeks is an 53 y.o. female.  HPI: Patient is a very pleasant 53 year old female who presents with Spanish interpreter to discuss options for improving excess skin of her neck.  She reports she has not had any treatments in the past.  She reports she is getting married in July.  She is specifically interested in lasers, but is open to any option that would help improve this area.  Physical Exam    02/27/2024    2:06 PM 03/01/2023    8:39 AM 12/05/2022    2:05 PM  Vitals with BMI  Height  4' 9.874   Weight 118 lbs 13 oz 117 lbs   BMI  24.56   Systolic 146 128 161  Diastolic 83 72 74  Pulse 75  80    General:  No acute distress,  Alert and oriented, Non-Toxic, Normal speech and affect HEENT: Mild sun damage noted of face with some hyperpigmentation noted throughout.  She does have some excess skin and banding along the chin/neck area.  It is very mild.  Assessment/Plan Patient is a very pleasant 53 year old female.  We discussed various options for improving this area including face/neck lift, halo laser, Kybella.  We did discuss that the halo laser can assist with skin tightening and collagen production which may be of benefit to her, however I did discuss that it is possible that it may not completely resolve the issue for her due to the limitations of the laser.  I discussed with patient with help of Spanish interpreter all of these recommendations.  We did discuss the pretreatment protocol with hydroquinone for any lasers due to her skin tone.  I do not think BBL would be a good option for her as it would not address her concerns.  I did discuss patient's care with Dr. Orin Birk and evaluated photos, discussed Chi Ramona Burner.  Ultimately think that that would be a good option for her.  I called patient with use of Spanish interpreter via  telephone and we spoke for 10 minutes on the phone and discussed recommendations.  She is going to think it over.  I did ultimately discussed with the patient that I think Kybella would be the best nonsurgical option for her  Pictures were obtained of the patient and placed in the chart with the patient's or guardian's permission.  Kayla Weeks 02/27/2024, 2:38 PM
